# Patient Record
Sex: Female | Born: 1990 | Race: White | Hispanic: No | Marital: Married | Smoking: Never smoker
Health system: Southern US, Community
[De-identification: ages and names within clinical notes are randomized; demographics above are authoritative.]

---

## 2020-02-21 ENCOUNTER — Emergency Department (INDEPENDENT_AMBULATORY_CARE_PROVIDER_SITE_OTHER)
Admission: EM | Admit: 2020-02-21 | Discharge: 2020-02-21 | Disposition: A | Discharge status: Home / Self Care | Payer: PRIVATE HEALTH INSURANCE | Source: Home / Self Care

## 2020-02-21 ENCOUNTER — Emergency Department (INDEPENDENT_AMBULATORY_CARE_PROVIDER_SITE_OTHER): Payer: PRIVATE HEALTH INSURANCE

## 2020-02-21 ENCOUNTER — Encounter: Payer: Self-pay | Admitting: Emergency Medicine

## 2020-02-21 ENCOUNTER — Other Ambulatory Visit: Payer: Self-pay

## 2020-02-21 DIAGNOSIS — R0789 Other chest pain: Secondary | ICD-10-CM | POA: Diagnosis not present

## 2020-02-21 MED ORDER — FAMOTIDINE 20 MG PO TABS: 20.0000 mg | ORAL_TABLET | Freq: Two times a day (BID) | ORAL | 0 refills | Status: AC

## 2020-02-21 MED ORDER — ALUM & MAG HYDROXIDE-SIMETH 200-200-20 MG/5ML PO SUSP
30.0000 mL | Freq: Once | ORAL | Status: AC
  Administered 2020-02-21: 30 mL via ORAL

## 2020-02-21 MED ORDER — LIDOCAINE VISCOUS HCL 2 % MT SOLN
15.0000 mL | Freq: Once | OROMUCOSAL | Status: AC
  Administered 2020-02-21: 15 mL via ORAL

## 2020-02-21 MED ORDER — FAMOTIDINE 20 MG PO TABS: 20.0000 mg | ORAL_TABLET | Freq: Two times a day (BID) | ORAL | 0 refills | Status: DC

## 2020-02-21 NOTE — ED Provider Notes (Signed)
[facility]    CSN: [id number] Arrival date & time: 02/21/20  0825      History   Chief Complaint Chief Complaint  Patient presents with  . Chest Pain    HPI Leslie Torres is a 29 y.o. female.   HPI Leslie Torres is a 29 y.o. female presenting to UC with c/o intermittent mid sternal, right sided chest pain for about 1 month, radiates into upper chest and neck with a shooting pain at times. Worse with deep inspiration, worse in the morning and worse when lying down.  This morning is worse than it has been, constant since it woke her from her sleep this morning. No medications taken PTA today but she has taken up to 1000mg  ibuprofen in the past for the pain, which does seem to help. Denies cough, congestion, fever, chills, vomiting or diarrhea but has had occasional mild nausea.  No personal or family hx of CAD.  Denies hx of acid reflux. She has not noticed if symptoms better or worse with eating or drinking.  She has not had the COVID vaccine. She is new to the area, cannot f/u with a PCP for a few weeks.    History reviewed. No pertinent past medical history.  There are no problems to display for this patient.   History reviewed. No pertinent surgical history.  OB History   No obstetric history on file.      Home Medications    Prior to Admission medications   Medication Sig Start Date End Date Taking? Authorizing Provider  famotidine (PEPCID) 20 MG tablet Take 1 tablet (20 mg total) by mouth 2 (two) times daily for 14 days. 02/21/20 03/06/20  05/06/20, PA-C  NORTREL 0.5/35, 28, 0.5-35 MG-MCG tablet Take 1 tablet by mouth daily. 12/16/19   [provider]    Family History Family History  Problem Relation Age of Onset  . Hypertension Mother   . Thyroid disease Mother   . Depression Father     Social History Social History   Tobacco Use  . Smoking status: Never Smoker  . Smokeless tobacco: Never Used  Vaping Use  . Vaping Use: Never used    Substance Use Topics  . Alcohol use: Not Currently  . Drug use: Never     Allergies   Sulfamethoxazole-trimethoprim   Review of Systems Review of Systems  Constitutional: Negative for chills and fever.  HENT: Positive for sore throat. Negative for congestion, ear pain, trouble swallowing and voice change.   Respiratory: Negative for cough and shortness of breath.   Cardiovascular: Positive for chest pain. Negative for palpitations.  Gastrointestinal: Negative for abdominal pain, diarrhea, nausea and vomiting.  Musculoskeletal: Positive for neck pain. Negative for arthralgias, back pain, myalgias and neck stiffness.  Skin: Negative for rash.  Neurological: Negative for dizziness and headaches.  All other systems reviewed and are negative.    Physical Exam Triage Vital Signs ED Triage Vitals  Enc Vitals Group     BP 02/21/20 0844 140/90     Pulse Rate 02/21/20 0844 84     Resp 02/21/20 0844 17     Temp 02/21/20 0844 98.2 F (36.8 C)     Temp Source 02/21/20 0844 Oral     SpO2 02/21/20 0844 98 %     Weight 02/21/20 0851 140 lb (63.5 kg)     Height 02/21/20 0851 5\' 6"  (1.676 m)     Head Circumference --      Peak  Flow --      Pain Score 02/21/20 0849 4     Pain Loc --      Pain Edu? --      Excl. in GC? --    No data found.  Updated Vital Signs BP 140/90 (BP Location: Right Arm)   Pulse 84   Temp 98.2 F (36.8 C) (Oral)   Resp 17   Ht 5\' 6"  (1.676 m)   Wt 140 lb (63.5 kg)   LMP 02/13/2020 (Exact Date)   SpO2 98%   BMI 22.60 kg/m   Visual Acuity Right Eye Distance:   Left Eye Distance:   Bilateral Distance:    Right Eye Near:   Left Eye Near:    Bilateral Near:     Physical Exam Vitals and nursing note reviewed.  Constitutional:      General: She is not in acute distress.    Appearance: She is well-developed. She is not ill-appearing, toxic-appearing or diaphoretic.  HENT:     Head: Normocephalic and atraumatic.     Mouth/Throat:     Lips:  Pink.     Mouth: Mucous membranes are moist.     Pharynx: Oropharynx is clear. Uvula midline. No pharyngeal swelling or uvula swelling.  Neck:     Thyroid: No thyromegaly.     Trachea: No tracheal deviation.  Cardiovascular:     Rate and Rhythm: Normal rate and regular rhythm.  Pulmonary:     Effort: Pulmonary effort is normal.     Breath sounds: No decreased breath sounds, wheezing, rhonchi or rales.  Chest:     Chest wall: No deformity or tenderness.  Abdominal:     Palpations: Abdomen is soft. There is no hepatomegaly, splenomegaly or mass.     Tenderness: There is abdominal tenderness (minimal epigastric). There is no guarding or rebound.  Musculoskeletal:        General: Normal range of motion.     Cervical back: Normal range of motion and neck supple.  Lymphadenopathy:     Cervical: No cervical adenopathy.  Skin:    General: Skin is warm and dry.  Neurological:     Mental Status: She is alert and oriented to person, place, and time.  Psychiatric:        Behavior: Behavior normal.      UC Treatments / Results  Labs (all labs ordered are listed, but only abnormal results are displayed) Labs Reviewed - No data to display  EKG Date/Time:02/21/2020    09:15:46 Ventricular Rate: 95 PR Interval: 130 QRS Duration: 76 QT Interval: 364 QTC Calculation: 457 P-R-T axes: 60   29   52 Text Interpretation: Normal sinus rhythm with sinus arrhythmia. Normal ECG    Radiology DG Chest 2 View  Result Date: 02/21/2020 CLINICAL DATA:  Chest tightness EXAM: CHEST - 2 VIEW COMPARISON:  None. FINDINGS: The heart size and mediastinal contours are within normal limits. Both lungs are clear. The visualized skeletal structures are unremarkable. IMPRESSION: Normal chest radiographs. Electronically Signed   By: 02/23/2020 D.O.   On: 02/21/2020 09:35    Procedures Procedures (including critical care time)  Medications Ordered in UC Medications  alum & mag  hydroxide-simeth (MAALOX/MYLANTA) 200-200-20 MG/5ML suspension 30 mL (30 mLs Oral Given 02/21/20 0908)    And  lidocaine (XYLOCAINE) 2 % viscous mouth solution 15 mL (15 mLs Oral Given 02/21/20 0908)    Initial Impression / Assessment and Plan / UC Course  I have reviewed the triage  vital signs and the nursing notes.  Pertinent labs & imaging results that were available during my care of the patient were reviewed by me and considered in my medical decision making (see chart for details).    Discussed imaging and EKG with pt Chest pain is atypical DDx: esophageal spasms, GERD, costochondritis Encouraged f/u with PCP Discussed symptoms that warrant emergent care in the ED. AVS given  Final Clinical Impressions(s) / UC Diagnoses   Final diagnoses:  Atypical chest pain     Discharge Instructions      Call to schedule a follow up appointment with a primary care provider and recheck of symptoms in 1-2 weeks if not improving.   Call 911 or have someone drive you to the hospital if symptoms significantly worsening- worsening pain, trouble breathing, dizziness/passing out, or other new concerning symptoms develop.     ED Prescriptions    Medication Sig Dispense Auth. Provider   famotidine (PEPCID) 20 MG tablet  (Status: Discontinued) Take 1 tablet (20 mg total) by mouth 2 (two) times daily for 14 days. 10 tablet Waylan Rocher O, PA-C   famotidine (PEPCID) 20 MG tablet Take 1 tablet (20 mg total) by mouth 2 (two) times daily for 14 days. 28 tablet Lurene Shadow, [place]     PDMP not reviewed this encounter.   Lurene Shadow, PA-C 02/21/20 1019

## 2020-02-21 NOTE — ED Triage Notes (Addendum)
Mid sternal pain x 1 month - describes as a shooting pain - will sometimes radiate up the right side of her neck No OTC meds today , will sometimes take Ibuprofen  Denies any fever or chills, numbness or tingling Pain is worse in the am when she wakes up  Denies hx of GERD NO COVID vaccine

## 2020-02-21 NOTE — Discharge Instructions (Signed)
°  Call to schedule a follow up appointment with a primary care provider and recheck of symptoms in 1-2 weeks if not improving.   Call 911 or have someone drive you to the hospital if symptoms significantly worsening- worsening pain, trouble breathing, dizziness/passing out, or other new concerning symptoms develop.

## 2021-08-06 IMAGING — DX DG CHEST 2V
2 series · 2 of 2 positions shown · non-contrast
Comparison: None.

CLINICAL DATA: Chest tightness

EXAM:
CHEST - 2 VIEW

[chest pa]
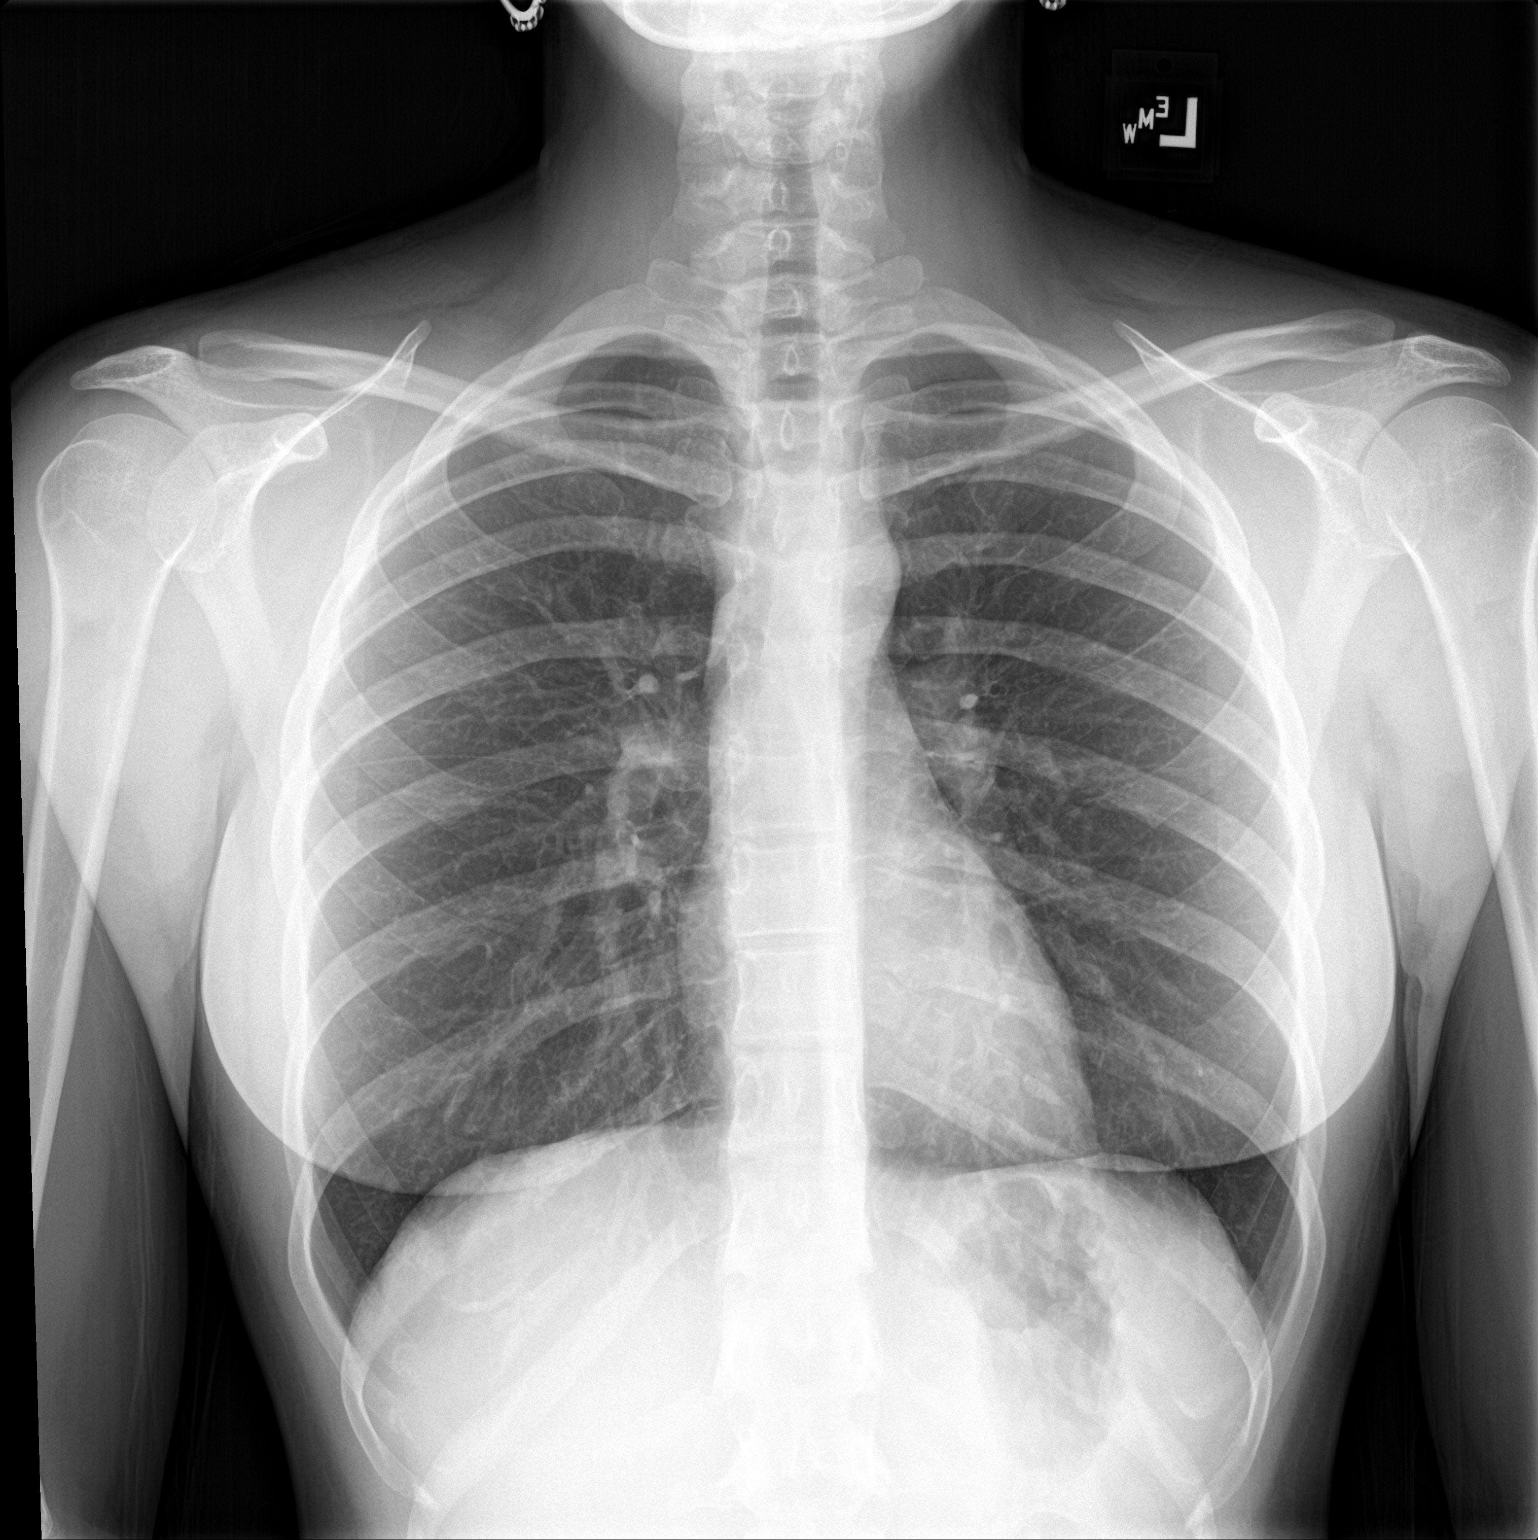

[chest lat]
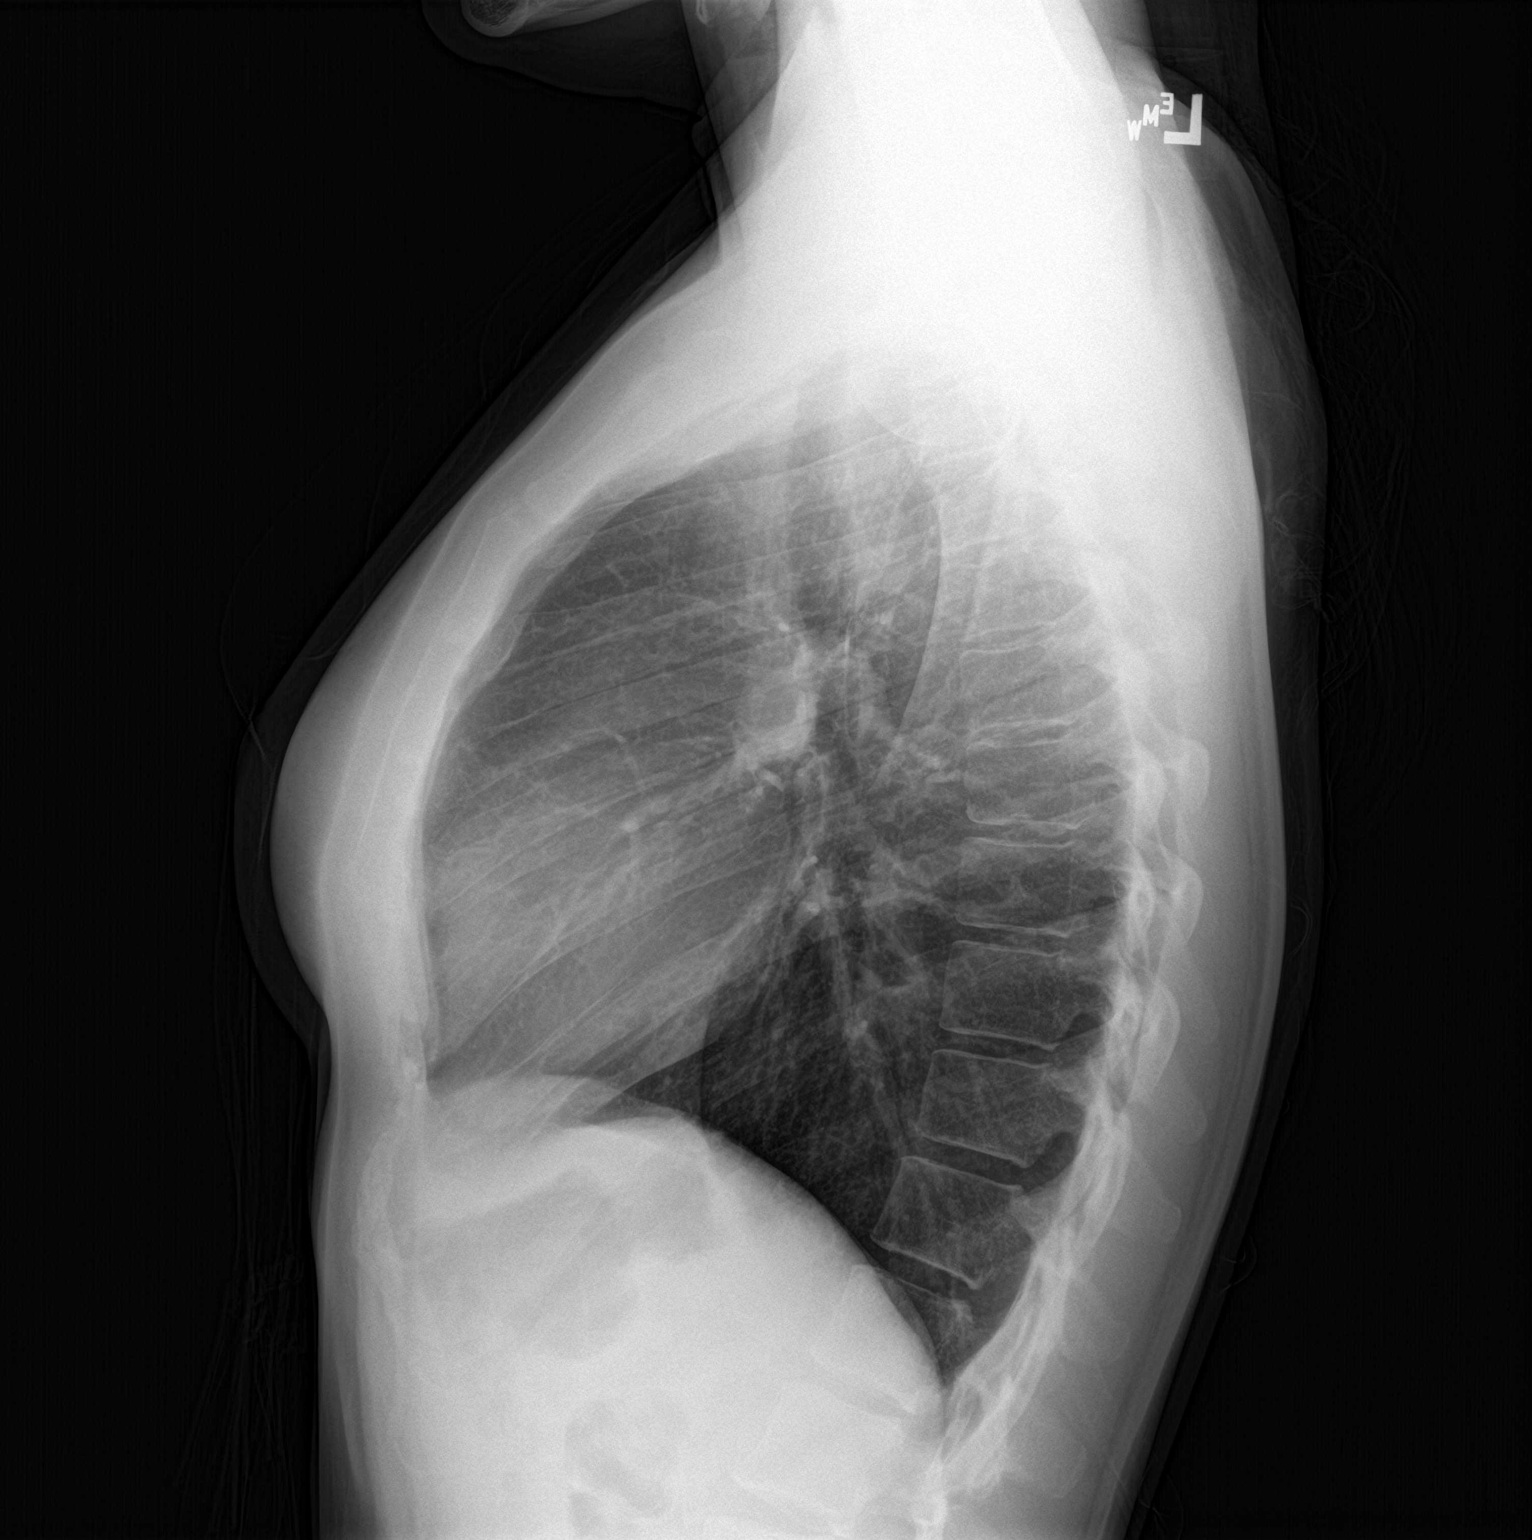

[2 of 2 positions shown; findings below may reference images not displayed]

FINDINGS: The heart size and mediastinal contours are within normal limits.
Both lungs are clear. The visualized skeletal structures are
unremarkable.
IMPRESSION: Normal chest radiographs.
